# Patient Record
Sex: Female | Born: 1950 | Race: White | Hispanic: No | Smoking: Never smoker
Health system: Southern US, Community
[De-identification: ages and names within clinical notes are randomized; demographics above are authoritative.]

---

## 1999-06-11 ENCOUNTER — Encounter: Admission: RE | Admit: 1999-06-11 | Discharge: 1999-06-11 | Payer: Self-pay | Admitting: Internal Medicine

## 1999-06-11 ENCOUNTER — Encounter: Payer: Self-pay | Admitting: Internal Medicine

## 1999-08-18 ENCOUNTER — Ambulatory Visit (HOSPITAL_BASED_OUTPATIENT_CLINIC_OR_DEPARTMENT_OTHER): Admission: RE | Admit: 1999-08-18 | Discharge: 1999-08-18 | Payer: Self-pay | Admitting: Orthopaedic Surgery

## 2000-06-28 ENCOUNTER — Other Ambulatory Visit: Admission: RE | Admit: 2000-06-28 | Discharge: 2000-06-28 | Payer: Self-pay | Admitting: *Deleted

## 2000-12-20 ENCOUNTER — Emergency Department (HOSPITAL_COMMUNITY): Admission: EM | Admit: 2000-12-20 | Discharge: 2000-12-20 | Payer: Self-pay

## 2000-12-20 ENCOUNTER — Encounter: Payer: Self-pay | Admitting: Emergency Medicine

## 2001-06-21 ENCOUNTER — Other Ambulatory Visit: Admission: RE | Admit: 2001-06-21 | Discharge: 2001-06-21 | Payer: Self-pay | Admitting: *Deleted

## 2002-07-13 ENCOUNTER — Emergency Department (HOSPITAL_COMMUNITY): Admission: EM | Admit: 2002-07-13 | Discharge: 2002-07-13 | Payer: Self-pay | Admitting: Emergency Medicine

## 2002-07-13 ENCOUNTER — Encounter: Payer: Self-pay | Admitting: Emergency Medicine

## 2002-07-16 ENCOUNTER — Ambulatory Visit (HOSPITAL_BASED_OUTPATIENT_CLINIC_OR_DEPARTMENT_OTHER): Admission: RE | Admit: 2002-07-16 | Discharge: 2002-07-16 | Payer: Self-pay | Admitting: *Deleted

## 2002-09-12 ENCOUNTER — Other Ambulatory Visit: Admission: RE | Admit: 2002-09-12 | Discharge: 2002-09-12 | Payer: Self-pay | Admitting: *Deleted

## 2002-11-27 ENCOUNTER — Other Ambulatory Visit: Admission: RE | Admit: 2002-11-27 | Discharge: 2002-11-27 | Payer: Self-pay | Admitting: *Deleted

## 2003-01-30 ENCOUNTER — Encounter: Admission: RE | Admit: 2003-01-30 | Discharge: 2003-01-30 | Payer: Self-pay | Admitting: Geriatric Medicine

## 2004-03-26 ENCOUNTER — Other Ambulatory Visit: Admission: RE | Admit: 2004-03-26 | Discharge: 2004-03-26 | Payer: Self-pay | Admitting: *Deleted

## 2005-01-20 ENCOUNTER — Encounter: Admission: RE | Admit: 2005-01-20 | Discharge: 2005-01-20 | Payer: Self-pay | Admitting: Geriatric Medicine

## 2005-10-12 ENCOUNTER — Other Ambulatory Visit: Admission: RE | Admit: 2005-10-12 | Discharge: 2005-10-12 | Payer: Self-pay | Admitting: *Deleted

## 2005-12-29 ENCOUNTER — Encounter: Admission: RE | Admit: 2005-12-29 | Discharge: 2005-12-29 | Payer: Self-pay | Admitting: Geriatric Medicine

## 2006-12-15 ENCOUNTER — Other Ambulatory Visit: Admission: RE | Admit: 2006-12-15 | Discharge: 2006-12-15 | Payer: Self-pay | Admitting: Geriatric Medicine

## 2007-10-06 ENCOUNTER — Encounter: Admission: RE | Admit: 2007-10-06 | Discharge: 2007-10-06 | Payer: Self-pay | Admitting: Sports Medicine

## 2007-12-12 ENCOUNTER — Encounter: Admission: RE | Admit: 2007-12-12 | Discharge: 2007-12-12 | Payer: Self-pay | Admitting: Sports Medicine

## 2007-12-30 ENCOUNTER — Encounter: Admission: RE | Admit: 2007-12-30 | Discharge: 2007-12-30 | Payer: Self-pay | Admitting: Sports Medicine

## 2010-02-22 ENCOUNTER — Encounter: Payer: Self-pay | Admitting: Sports Medicine

## 2010-06-19 NOTE — Op Note (Signed)
[place]  Patient:    Caroline Fields, Caroline Fields                         MRN: [id number] Proc. Date: 08/18/99 Adm. Date:  [id number] Attending:  Marcene Corning                           Operative Report  PREOPERATIVE DIAGNOSIS:  Right knee torn medial meniscus.  POSTOPERATIVE DIAGNOSES: 1. Right knee pathologic plica. 2. 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  PROCEDURES: 1. Right knee plica excision. 2. Right knee chondroplasty of medial femoral condyle.  ANESTHESIA:  Knee block.  SURGEON:  Lubertha Basque. Jerl Santos, M.D.  ASSISTANT:  None.  INDICATIONS FOR PROCEDURE:  The patient is a 60 year old woman who was seen for a few years for knee pain.  She did fairly well until about a month ago when she developed severe pain once again.  It is along the inside aspect of her knee after a twist at the pool.  She has persisted with pain since that time despite rest and oral anti-inflammatories.  She is offered an arthroscopy at this time.  The procedure was discussed with the patient and informed operative consent was obtained after discussion of the possible complications of reaction to anesthesia and infection.  DESCRIPTION OF PROCEDURE:  The patient was taken to the operating suite after knee block anesthetic was applied without difficulty.  She was positioned supine, and prepped and draped in the normal sterile fashion.  After the administration of preoperative IV antibiotics, an arthroscopy of the right knee was performed through two inferior portals.  The suprapatellar pouch was benign and the patellofemoral joint tracked well.  She had a thick pathologic medial shelf plica which was rubbing into the medial femoral condyle and was causing some grade 3 chondromalacia.  The plica was excised with the baskets and full radius resector.  The shaver was then used to smooth the medial femoral condyle.  She had a small area of exposed bone directly  under the plica, but for the most part, her changes are grade 3.  The medial and lateral compartments were benign with no evidence of meniscal or articular cartilage injury.  The ACL and PCL were intact.  The knee joint was thoroughly irrigated at the end of the case followed by placement of Marcaine with epinephrine and morphine.  Adaptic was placed over her portals followed by dry gauze and a loose Ace wrap.  The estimated blood loss and intraoperative fluids can be obtained from anesthesia records.  DISPOSITION:  The patient was taken to the recovery room in stable condition. Plans were for her to go home the same day and follow up in the office in less than a week.  I will contact her by phone tonight. DD:  08/18/99 TD:  08/18/99 Job: [id number]

## 2010-06-19 NOTE — Op Note (Signed)
NAME:  GERRY, HEAPHY                            ACCOUNT NO.:  [id number]   MEDICAL RECORD NO.:  [id number]                   PATIENT TYPE:  AMB   LOCATION:  [facility]                                  FACILITY:  [facility]   PHYSICIAN:  Lowell Bouton, M.D.      DATE OF BIRTH:  Nov 22, 1950   DATE OF PROCEDURE:  07/17/2002  DATE OF DISCHARGE:                                 OPERATIVE REPORT   PREOPERATIVE DIAGNOSIS:  Comminuted fracture, right ring finger middle  phalanx.   POSTOPERATIVE DIAGNOSIS:  Comminuted fracture, right ring finger middle  phalanx.   PROCEDURE:  Open reduction and internal fixation, right ring finger middle  phalanx.   SURGEON:  Lowell Bouton, M.D.   ANESTHESIA:  Axillary block.   OPERATIVE FINDINGS:  The patient had a twisting-type injury with a  comminuted fracture through the midportion of the middle phalanx.  There was  a longitudinal component and a transverse component.   PROCEDURE:  Under axillary block anesthesia with a tourniquet on the right  arm, the right hand was prepped and draped in the usual fashion and after  exsanguinating the limb, the tourniquet was inflated to 250 mmHg.  A  longitudinal incision was made in the midline over the dorsum of the middle  phalanx.  Sharp dissection was carried through the subcutaneous tissues and  bleeding points were coagulated.  Sharp dissection was carried down through  the extensor tendon and a Therapist, nutritional was used to elevate the tendon away  from the bone.  The fracture was then identified and a curette was used to  clean any hematoma from the fracture site.  Reduction was performed by  longitudinal traction and rotation.  The distal fracture was repaired first  with the mini-fragment set using the 1.5 mm screw.  A transverse screw was  placed, stabilizing the distal longitudinal fracture.  The transverse  component was actually a zigzag type of fracture without significant  obliquity to  it.  It was reduced and could not be held stably with a clamp.  For that reason a 0.035 K-wire was placed obliquely across the fracture and  it was felt that this would have to be the fixation.  A screw could not be  applied due to the transverse nature.  A second 0.035 K-wire was then placed  obliquely across the fracture site.  X-rays showed good alignment, and  clinically the rotation was appropriate.  The K-wires were left protruding  from the skin and were bent over.  The wound was irrigated copiously with  saline.  The extensor tendon was repaired with a 4-0 Monocryl and the skin  with a 4-0 nylon.  Sterile dressings were applied, followed by an Alumafoam  splint.  The tourniquet was released with good circulation to the hand.  The  patient went to the recovery room awake and stable and in good condition.  Lowell Bouton, M.D.    EMM/MEDQ  D:  07/17/2002  T:  07/17/2002  Job:  [id number]

## 2012-01-31 ENCOUNTER — Other Ambulatory Visit (HOSPITAL_COMMUNITY)
Admission: RE | Admit: 2012-01-31 | Discharge: 2012-01-31 | Disposition: A | Discharge status: Home / Self Care | Payer: No Typology Code available for payment source | Source: Ambulatory Visit | Attending: Geriatric Medicine | Admitting: Geriatric Medicine

## 2012-01-31 DIAGNOSIS — Z1151 Encounter for screening for human papillomavirus (HPV): Secondary | ICD-10-CM | POA: Insufficient documentation

## 2012-01-31 DIAGNOSIS — Z01419 Encounter for gynecological examination (general) (routine) without abnormal findings: Secondary | ICD-10-CM | POA: Insufficient documentation

## 2013-05-16 ENCOUNTER — Ambulatory Visit
Admission: RE | Admit: 2013-05-16 | Discharge: 2013-05-16 | Disposition: A | Discharge status: Home / Self Care | Payer: BC Managed Care – PPO | Source: Ambulatory Visit | Attending: Geriatric Medicine | Admitting: Geriatric Medicine

## 2013-05-16 ENCOUNTER — Other Ambulatory Visit: Payer: Self-pay | Admitting: Geriatric Medicine

## 2013-05-16 DIAGNOSIS — M79609 Pain in unspecified limb: Secondary | ICD-10-CM

## 2013-06-13 ENCOUNTER — Other Ambulatory Visit: Payer: Self-pay | Admitting: Geriatric Medicine

## 2013-06-13 DIAGNOSIS — R209 Unspecified disturbances of skin sensation: Secondary | ICD-10-CM

## 2013-06-21 ENCOUNTER — Ambulatory Visit
Admission: RE | Admit: 2013-06-21 | Discharge: 2013-06-21 | Disposition: A | Discharge status: Home / Self Care | Payer: BC Managed Care – PPO | Source: Ambulatory Visit | Attending: Geriatric Medicine | Admitting: Geriatric Medicine

## 2013-06-21 DIAGNOSIS — R209 Unspecified disturbances of skin sensation: Secondary | ICD-10-CM

## 2013-08-31 ENCOUNTER — Ambulatory Visit (INDEPENDENT_AMBULATORY_CARE_PROVIDER_SITE_OTHER): Payer: BC Managed Care – PPO | Admitting: Medical

## 2013-08-31 ENCOUNTER — Encounter: Payer: Self-pay | Admitting: Medical

## 2013-08-31 VITALS — BP 112/80 | HR 62 | Temp 98.2°F | Wt 132.0 lb

## 2013-08-31 DIAGNOSIS — F430 Acute stress reaction: Secondary | ICD-10-CM

## 2013-08-31 DIAGNOSIS — R8290 Unspecified abnormal findings in urine: Secondary | ICD-10-CM

## 2013-08-31 DIAGNOSIS — R142 Eructation: Secondary | ICD-10-CM

## 2013-08-31 DIAGNOSIS — J0100 Acute maxillary sinusitis, unspecified: Secondary | ICD-10-CM

## 2013-08-31 DIAGNOSIS — R140 Abdominal distension (gaseous): Secondary | ICD-10-CM

## 2013-08-31 DIAGNOSIS — R141 Gas pain: Secondary | ICD-10-CM

## 2013-08-31 DIAGNOSIS — R143 Flatulence: Secondary | ICD-10-CM

## 2013-08-31 DIAGNOSIS — R82998 Other abnormal findings in urine: Secondary | ICD-10-CM

## 2013-08-31 LAB — POCT URINALYSIS DIPSTICK
BILIRUBIN UA: NEGATIVE
GLUCOSE UA: NEGATIVE
Ketones, UA: NEGATIVE
LEUKOCYTES UA: NEGATIVE
Nitrite, UA: NEGATIVE
Protein, UA: NEGATIVE
RBC UA: NEGATIVE
Spec Grav, UA: <=1.005
UROBILINOGEN UA: NEGATIVE
pH, UA: 6

## 2013-08-31 MED ORDER — AMOXICILLIN 875 MG PO TABS: 875.0000 mg | ORAL_TABLET | Freq: Two times a day (BID) | ORAL | Status: DC

## 2013-08-31 NOTE — Addendum Note (Signed)
Addended by: Janeice RobinsonSCALES, Taleia Sadowski L on: 08/31/2013 03:42 PM   Modules accepted: Orders

## 2013-08-31 NOTE — Progress Notes (Signed)
Subjective:  Caroline Fields is a [age] y.o. female who presents as a new patient.    Was seeing [facility] prior.   She heard good things about Dr. Lynelle DoctorKnapp and wanted to be at this office.  She reports 2 weeks of sinus pressure, headache, sore throat, postnasal popping in the ears, hurts under ears and eyes, using antihistamine, nose spray, negative, saltwater gargles and nasal saline. Feels congested in the head and chest . Has had occasional loose stool about once a week for the last 3 weeks.  Has been bloated and gassy, urine has an odor, looks cloudy . She has been prone to sinus infections in the past.  She is a nonsmoker. Denies sick contacts.  No other aggravating or relieving factors.  No other c/o.  ROS as in subjective   Objective: Filed Vitals:   08/31/13 1441  BP: 112/80  Pulse: 62  Temp: 98.2 F (36.8 C)    General appearance: Alert, WD/WN, no distress, white female                             Skin: warm, no rash                           Head: +maxillary sinus tenderness,                            Eyes: conjunctiva normal, corneas clear, PERRLA                            Ears:serous effusion behind both TMs, external ear canals normal                          Nose: septum midline, turbinates swollen, with erythema and clear discharge             Mouth/throat: MMM, tongue normal, mild pharyngeal erythema                           Neck: supple, no adenopathy, no thyromegaly, nontender                          Heart: RRR, normal S1, S2, no murmurs                         Lungs: CTA bilaterally, no wheezes, rales, or rhonchi   Abdomen: +bs, soft, mild suprapubic tendnerss, no mass, no organomegaly, no rebound    Assessment and Plan: Encounter Diagnoses  Name Primary?  . Acute maxillary sinusitis, recurrence not specified Yes  . Abnormal urine odor   . Abdominal bloating   . Acute stress reaction     Prescription given for Amoxicillin.  Can use OTC Mucinex for congestion.   Tylenol or Ibuprofen OTC for fever and malaise.  Discussed symptomatic relief, nasal saline flush, and call or return if worse or not improving in 2-3 days.    Urinalysis normal, no sign of infection.  Discussed water intake, hygiene.  She notes at end of visit that she had to take in her handicapped brother and lots of stress at home with caring for him currently.  She inquired about counseling.  Gave list of counselors.  She  declines referral but will look into counseling.  F/u soon if needed to discuss further.

## 2013-08-31 NOTE — Patient Instructions (Signed)
Counseling services   [facility] [phone] office [email or web address] [address], [place], [place] [address]  Laura Atkinson, therapist  Erik Nelson, MA, clinical psychologist  Cognitive-Behavior Therapy; Mood Disorders; Anxiety Disorders; adult and child ADHD; Family Therapy; Stress Management; personal growth, and Marital Therapy.    Dennis McKnight Ph.D., clinical psychologist Cognitive-Behavior Therapy; Mood Disorders; Anxiety Disorders; Stress     Management   [facility] [address], [place], [place] [address] [phone]   [facility] Desiree Wilkinson, psychotherapist [address] [place], [place] [address] [phone]   Elaine Talbert Ph.D., clinical psychologist [phone] office [address] [place], [place] [address] Cognitive Behavior Therapy, Depression, Bipolar, Anxiety, Grief and Loss   

## 2014-01-30 ENCOUNTER — Ambulatory Visit
Admission: RE | Admit: 2014-01-30 | Discharge: 2014-01-30 | Disposition: A | Discharge status: Home / Self Care | Payer: BC Managed Care – PPO | Source: Ambulatory Visit | Attending: Medical | Admitting: Medical

## 2014-01-30 ENCOUNTER — Encounter: Payer: Self-pay | Admitting: Medical

## 2014-01-30 ENCOUNTER — Ambulatory Visit (INDEPENDENT_AMBULATORY_CARE_PROVIDER_SITE_OTHER): Payer: BC Managed Care – PPO | Admitting: Medical

## 2014-01-30 VITALS — BP 140/90 | HR 72 | Temp 98.1°F | Resp 16 | Wt 135.0 lb

## 2014-01-30 DIAGNOSIS — S99922A Unspecified injury of left foot, initial encounter: Secondary | ICD-10-CM

## 2014-01-30 MED ORDER — HYDROCODONE-ACETAMINOPHEN 7.5-325 MG PO TABS: 1.0000 | ORAL_TABLET | Freq: Four times a day (QID) | ORAL | Status: AC | PRN

## 2014-01-30 NOTE — Addendum Note (Signed)
Addended by: Herminio CommonsJOHNSON, SABRINA A on: 01/30/2014 02:19 PM   Modules accepted: Orders

## 2014-01-30 NOTE — Progress Notes (Signed)
Subjective: Here for toe injury.  Stumped toe cleaning house barefooted on 01/28/14.  Called here the next day to get appt.   Having pain, swelling.  Hurts to bear weight.  Using some Ibuprofen OTC.  History reviewed. No pertinent past medical history.  ROS as in subjective otherwise   Objective:  Gen: wd,wn, nad Skin: left 3rd and 4th toes proximal phalanx with some purplish ecchymosis, mild swelling MSK: tender over 3rd and 4th toes proximal phalanx, decreased ROM, tender over distal metatarsals and MTP of 3rd and 4th toes as well, rest of foot nontender, no other deformity, otherwise ROM WNL Feet neurovascularly intact   Assessment: Encounter Diagnosis  Name Primary?  . Toe injury, left, initial encounter Yes    Plan: Go for xray.  Advised ice, elevated, avoid reinjury, Hydrocodone 1/2 -1 tablet po prn up to q6hrs for pain or OTC NSAID or Tylenol similarly.   F/u 2wk.

## 2014-01-30 NOTE — Patient Instructions (Signed)
Toe injury/possible fracture  Go for xray Use ice, elevation, post op shoe Consider crutches You can use either OTC Ibuprofen or Tylenol, or for worse pain, can use Hydrocodone as needed, 1/2-1 tablet every 6 hours for pain Avoid reinjury Recheck in 2 weeks.

## 2014-01-31 ENCOUNTER — Telehealth: Payer: Self-pay

## 2014-01-31 NOTE — Telephone Encounter (Signed)
Left message on patients cell phone that we are not certain if her particular pharmacy will have the shoe she needs but that most pharmacies in  do carry them as do medical supply places. Patient to call back if she in unable to find this.

## 2014-02-08 ENCOUNTER — Telehealth: Payer: Self-pay | Admitting: Medical

## 2014-02-08 ENCOUNTER — Other Ambulatory Visit: Payer: Self-pay | Admitting: Medical

## 2014-02-08 MED ORDER — NAPROXEN 375 MG PO TABS: 375.0000 mg | ORAL_TABLET | Freq: Two times a day (BID) | ORAL | Status: AC

## 2014-02-08 NOTE — Telephone Encounter (Signed)
Pt called and stated that she re-injured her foot. She states she would like to have a few more pain pills called in for her. She also states she has a mix up with her insurance so right now she doesn't have coverage. Please call in a few pills as she will have to pay out of pocket until insuance gets fixed. Pt uses [facility] [address]. She can be reached [phone].

## 2014-02-08 NOTE — Telephone Encounter (Signed)
At this point, begin Naprosyn BID, can use ice, can use post op shoe or whatever protective boot/shoe she was wearing.  If not wearing one, we can call out post op shoe.

## 2014-02-08 NOTE — Telephone Encounter (Signed)
LMOM TO CB. CLS 

## 2014-02-11 NOTE — Telephone Encounter (Signed)
I left  A detailed message and I ask the patient to call back

## 2014-04-01 DIAGNOSIS — Z23 Encounter for immunization: Secondary | ICD-10-CM | POA: Diagnosis not present

## 2014-04-01 DIAGNOSIS — Z79899 Other long term (current) drug therapy: Secondary | ICD-10-CM | POA: Diagnosis not present

## 2014-04-01 DIAGNOSIS — M8580 Other specified disorders of bone density and structure, unspecified site: Secondary | ICD-10-CM | POA: Diagnosis not present

## 2014-04-01 DIAGNOSIS — E780 Pure hypercholesterolemia: Secondary | ICD-10-CM | POA: Diagnosis not present

## 2014-04-01 DIAGNOSIS — Z0000 Encounter for general adult medical examination without abnormal findings: Secondary | ICD-10-CM | POA: Diagnosis not present

## 2014-04-01 DIAGNOSIS — Z1389 Encounter for screening for other disorder: Secondary | ICD-10-CM | POA: Diagnosis not present

## 2014-04-03 DIAGNOSIS — M859 Disorder of bone density and structure, unspecified: Secondary | ICD-10-CM | POA: Diagnosis not present

## 2014-04-03 DIAGNOSIS — M8580 Other specified disorders of bone density and structure, unspecified site: Secondary | ICD-10-CM | POA: Diagnosis not present

## 2014-05-08 DIAGNOSIS — Z1231 Encounter for screening mammogram for malignant neoplasm of breast: Secondary | ICD-10-CM | POA: Diagnosis not present

## 2014-05-11 DIAGNOSIS — S93692A Other sprain of left foot, initial encounter: Secondary | ICD-10-CM | POA: Diagnosis not present

## 2014-06-18 DIAGNOSIS — H5210 Myopia, unspecified eye: Secondary | ICD-10-CM | POA: Diagnosis not present

## 2014-06-18 DIAGNOSIS — Z0100 Encounter for examination of eyes and vision without abnormal findings: Secondary | ICD-10-CM | POA: Diagnosis not present

## 2014-06-18 DIAGNOSIS — H524 Presbyopia: Secondary | ICD-10-CM | POA: Diagnosis not present

## 2014-06-22 DIAGNOSIS — J069 Acute upper respiratory infection, unspecified: Secondary | ICD-10-CM | POA: Diagnosis not present

## 2014-07-15 DIAGNOSIS — J329 Chronic sinusitis, unspecified: Secondary | ICD-10-CM | POA: Diagnosis not present

## 2014-07-15 DIAGNOSIS — R300 Dysuria: Secondary | ICD-10-CM | POA: Diagnosis not present

## 2014-08-14 DIAGNOSIS — G4700 Insomnia, unspecified: Secondary | ICD-10-CM | POA: Diagnosis not present

## 2014-11-14 DIAGNOSIS — J329 Chronic sinusitis, unspecified: Secondary | ICD-10-CM | POA: Diagnosis not present

## 2014-11-14 DIAGNOSIS — H6121 Impacted cerumen, right ear: Secondary | ICD-10-CM | POA: Diagnosis not present

## 2014-11-25 DIAGNOSIS — R350 Frequency of micturition: Secondary | ICD-10-CM | POA: Diagnosis not present

## 2015-01-02 DIAGNOSIS — K5900 Constipation, unspecified: Secondary | ICD-10-CM | POA: Diagnosis not present

## 2015-01-02 DIAGNOSIS — N200 Calculus of kidney: Secondary | ICD-10-CM | POA: Diagnosis not present

## 2015-01-31 DIAGNOSIS — R350 Frequency of micturition: Secondary | ICD-10-CM | POA: Diagnosis not present

## 2015-01-31 DIAGNOSIS — Z23 Encounter for immunization: Secondary | ICD-10-CM | POA: Diagnosis not present

## 2015-05-14 DIAGNOSIS — E7800 Pure hypercholesterolemia, unspecified: Secondary | ICD-10-CM | POA: Diagnosis not present

## 2015-05-14 DIAGNOSIS — F5101 Primary insomnia: Secondary | ICD-10-CM | POA: Diagnosis not present

## 2015-05-14 DIAGNOSIS — Z79899 Other long term (current) drug therapy: Secondary | ICD-10-CM | POA: Diagnosis not present

## 2015-05-14 DIAGNOSIS — Z0000 Encounter for general adult medical examination without abnormal findings: Secondary | ICD-10-CM | POA: Diagnosis not present

## 2015-05-14 DIAGNOSIS — Z1389 Encounter for screening for other disorder: Secondary | ICD-10-CM | POA: Diagnosis not present

## 2015-05-14 DIAGNOSIS — Z23 Encounter for immunization: Secondary | ICD-10-CM | POA: Diagnosis not present

## 2015-05-22 DIAGNOSIS — Z1231 Encounter for screening mammogram for malignant neoplasm of breast: Secondary | ICD-10-CM | POA: Diagnosis not present

## 2015-05-22 DIAGNOSIS — Z803 Family history of malignant neoplasm of breast: Secondary | ICD-10-CM | POA: Diagnosis not present

## 2015-06-03 DIAGNOSIS — N898 Other specified noninflammatory disorders of vagina: Secondary | ICD-10-CM | POA: Diagnosis not present

## 2015-06-12 DIAGNOSIS — N952 Postmenopausal atrophic vaginitis: Secondary | ICD-10-CM | POA: Diagnosis not present

## 2015-06-12 DIAGNOSIS — R350 Frequency of micturition: Secondary | ICD-10-CM | POA: Diagnosis not present

## 2015-07-03 DIAGNOSIS — N816 Rectocele: Secondary | ICD-10-CM | POA: Diagnosis not present

## 2015-07-22 DIAGNOSIS — H524 Presbyopia: Secondary | ICD-10-CM | POA: Diagnosis not present

## 2015-07-22 DIAGNOSIS — Z0100 Encounter for examination of eyes and vision without abnormal findings: Secondary | ICD-10-CM | POA: Diagnosis not present

## 2015-07-22 DIAGNOSIS — H5210 Myopia, unspecified eye: Secondary | ICD-10-CM | POA: Diagnosis not present

## 2015-08-15 DIAGNOSIS — R002 Palpitations: Secondary | ICD-10-CM | POA: Diagnosis not present

## 2015-08-15 DIAGNOSIS — Z79899 Other long term (current) drug therapy: Secondary | ICD-10-CM | POA: Diagnosis not present

## 2015-08-15 DIAGNOSIS — E7800 Pure hypercholesterolemia, unspecified: Secondary | ICD-10-CM | POA: Diagnosis not present

## 2015-11-03 DIAGNOSIS — E7800 Pure hypercholesterolemia, unspecified: Secondary | ICD-10-CM | POA: Diagnosis not present

## 2015-11-03 DIAGNOSIS — Z79899 Other long term (current) drug therapy: Secondary | ICD-10-CM | POA: Diagnosis not present

## 2015-11-09 DIAGNOSIS — R21 Rash and other nonspecific skin eruption: Secondary | ICD-10-CM | POA: Diagnosis not present

## 2015-11-18 DIAGNOSIS — Z23 Encounter for immunization: Secondary | ICD-10-CM | POA: Diagnosis not present

## 2015-11-25 DIAGNOSIS — Z1211 Encounter for screening for malignant neoplasm of colon: Secondary | ICD-10-CM | POA: Diagnosis not present

## 2015-12-02 DIAGNOSIS — K6289 Other specified diseases of anus and rectum: Secondary | ICD-10-CM | POA: Diagnosis not present

## 2016-01-06 DIAGNOSIS — R6882 Decreased libido: Secondary | ICD-10-CM | POA: Diagnosis not present

## 2016-01-06 DIAGNOSIS — N941 Unspecified dyspareunia: Secondary | ICD-10-CM | POA: Diagnosis not present

## 2016-01-16 DIAGNOSIS — J111 Influenza due to unidentified influenza virus with other respiratory manifestations: Secondary | ICD-10-CM | POA: Diagnosis not present

## 2016-01-27 DIAGNOSIS — K644 Residual hemorrhoidal skin tags: Secondary | ICD-10-CM | POA: Diagnosis not present

## 2016-01-27 DIAGNOSIS — K6289 Other specified diseases of anus and rectum: Secondary | ICD-10-CM | POA: Diagnosis not present

## 2016-02-12 DIAGNOSIS — N898 Other specified noninflammatory disorders of vagina: Secondary | ICD-10-CM | POA: Diagnosis not present

## 2016-03-10 DIAGNOSIS — F339 Major depressive disorder, recurrent, unspecified: Secondary | ICD-10-CM | POA: Diagnosis not present

## 2016-03-29 DIAGNOSIS — J018 Other acute sinusitis: Secondary | ICD-10-CM | POA: Diagnosis not present

## 2016-10-30 IMAGING — CR DG FOOT COMPLETE 3+V*L*
3 series · 3 of 3 positions shown · non-contrast
Comparison: 05/16/2013.

CLINICAL DATA: Trauma 3 days ago.  Pain.  Initial evaluation.

EXAM:
LEFT FOOT - COMPLETE 3+ VIEW

[t foot ap left]
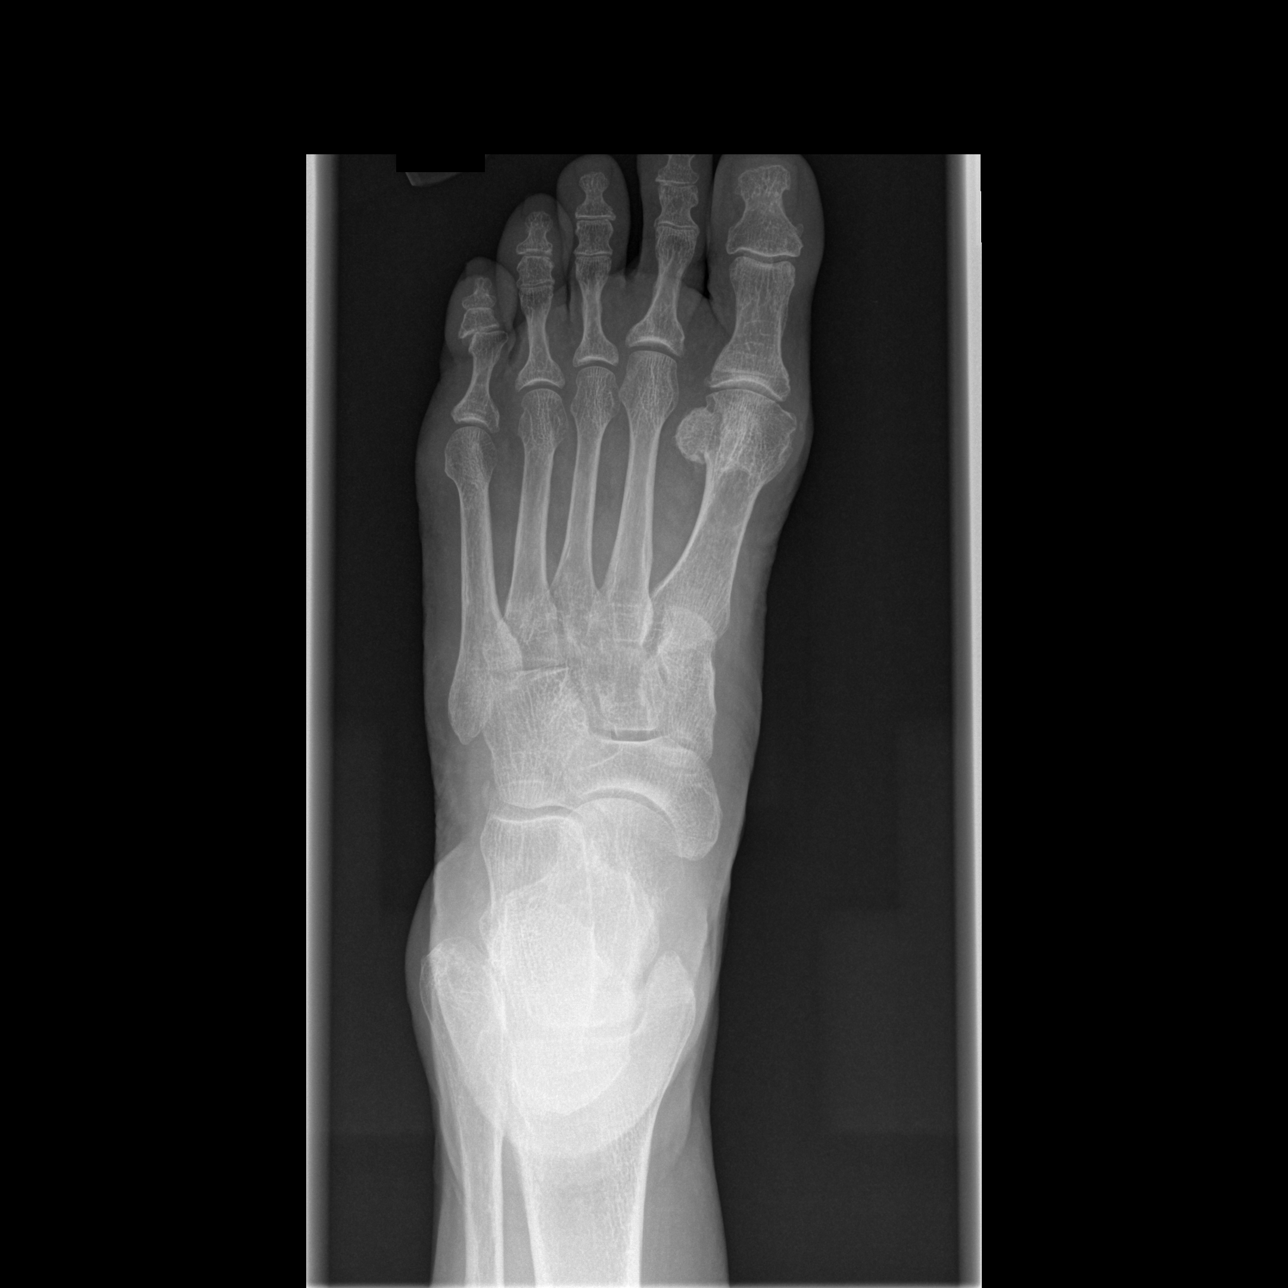

[t foot oblique left]
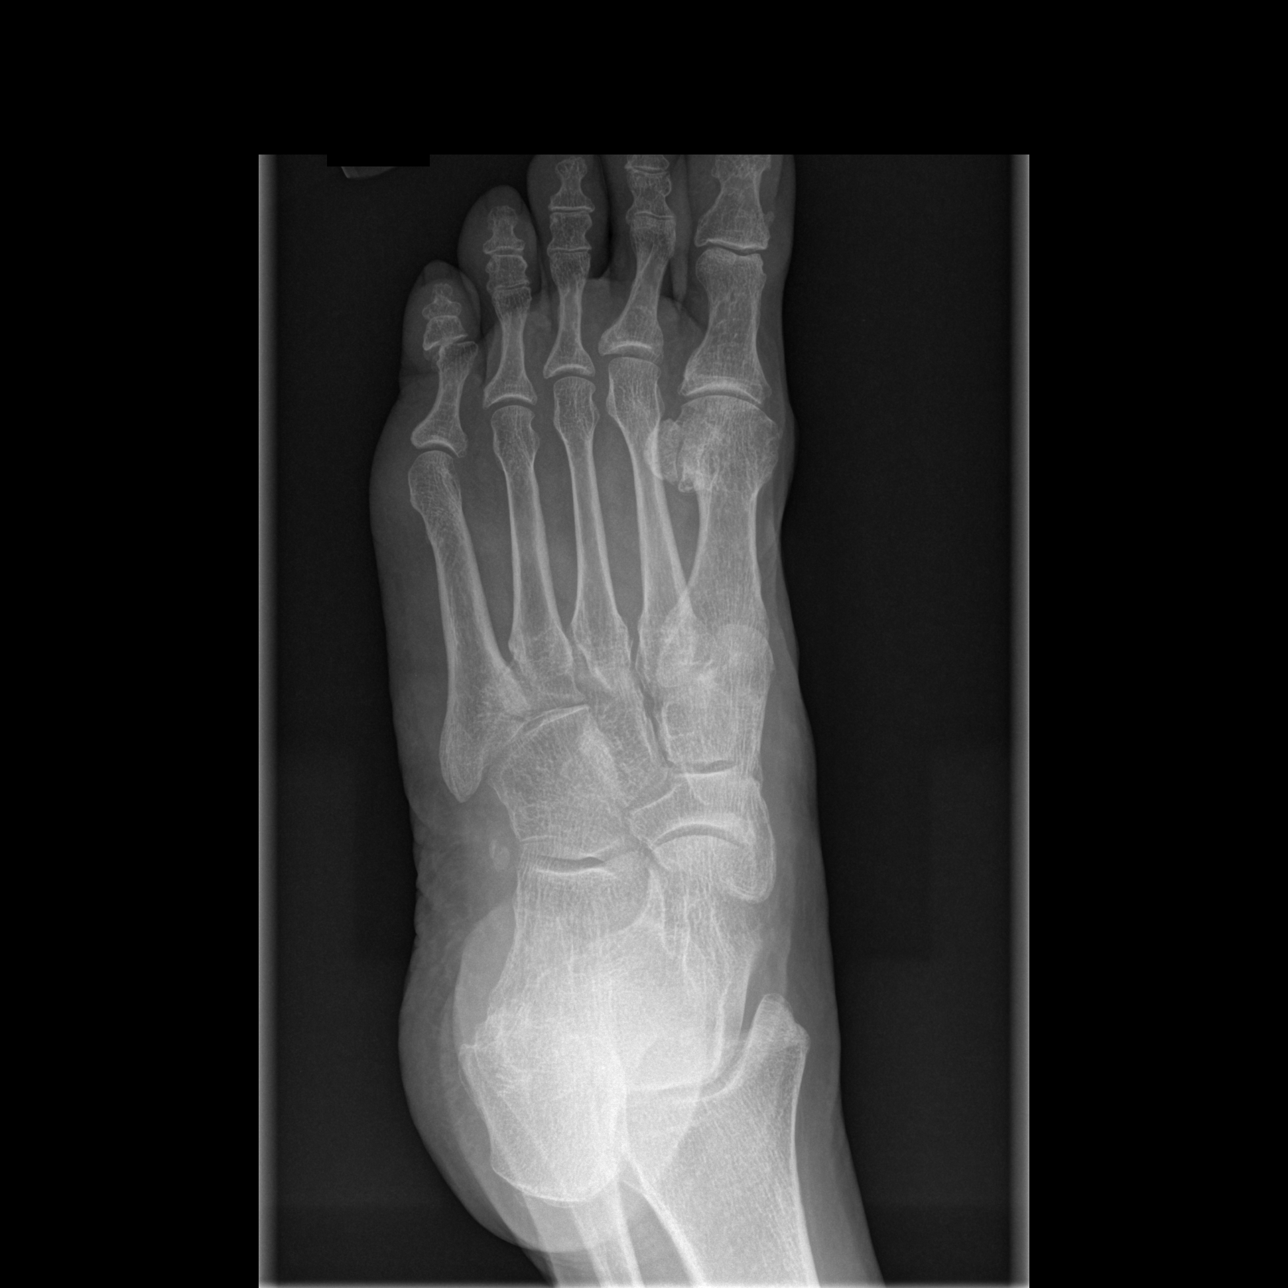

[t foot lat left]
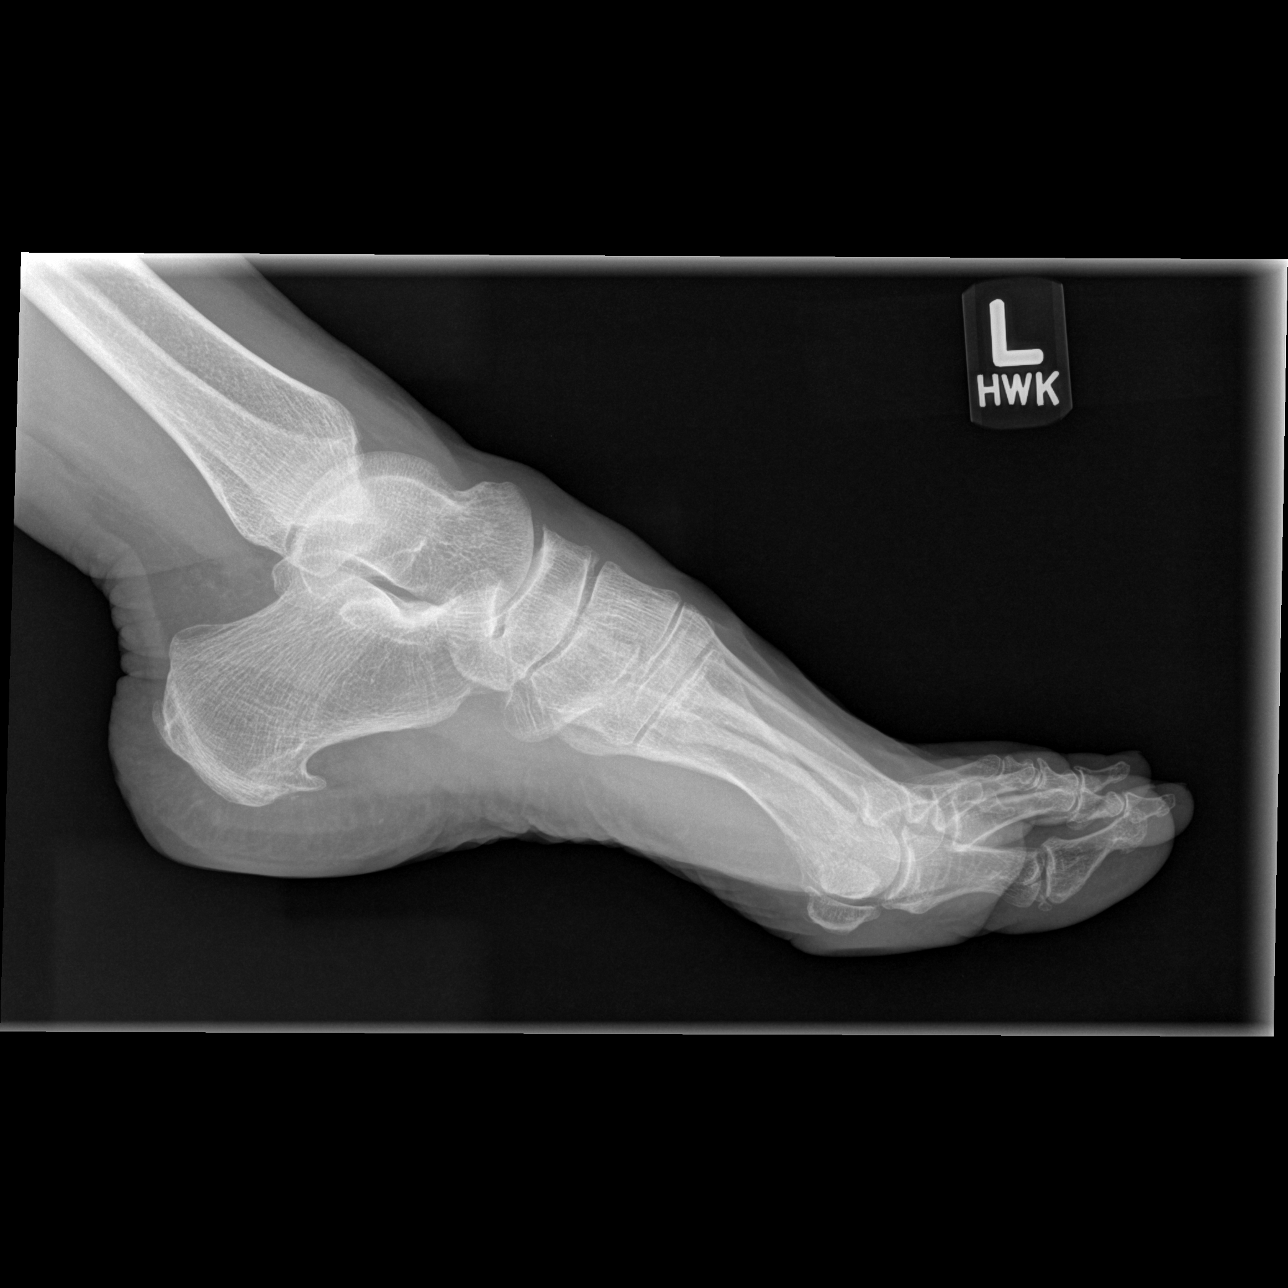

[3 of 3 positions shown; findings below may reference images not displayed]

FINDINGS: No acute bony or joint abnormality. Stable lateral subluxation
middle phalanx left fifth digit. Diffuse mild degenerative change.
No evidence of acute fracture or dislocation. Calcaneal spurring.
IMPRESSION: 1. No acute bony or joint abnormality.
2. Stable lateral subluxation middle phalanx left fifth digit.

## 2018-07-21 ENCOUNTER — Other Ambulatory Visit: Payer: Self-pay | Admitting: Physician Assistant

## 2018-07-21 DIAGNOSIS — M810 Age-related osteoporosis without current pathological fracture: Secondary | ICD-10-CM
